# Patient Record
Sex: Male | Born: 1975 | Race: Black or African American | Hispanic: No | Smoking: Former smoker
Health system: Southern US, Community
[De-identification: ages and names within clinical notes are randomized; demographics above are authoritative.]

## PROBLEM LIST (undated history)

## (undated) DIAGNOSIS — I10 Essential (primary) hypertension: Secondary | ICD-10-CM

## (undated) HISTORY — PX: EYE SURGERY: SHX253

## (undated) HISTORY — PX: FACELIFT: SHX1566

---

## 2014-02-24 ENCOUNTER — Emergency Department (HOSPITAL_BASED_OUTPATIENT_CLINIC_OR_DEPARTMENT_OTHER): Payer: BC Managed Care – PPO

## 2014-02-24 ENCOUNTER — Emergency Department (HOSPITAL_BASED_OUTPATIENT_CLINIC_OR_DEPARTMENT_OTHER)
Admission: EM | Admit: 2014-02-24 | Discharge: 2014-02-24 | Disposition: A | Discharge status: Home / Self Care | Payer: BC Managed Care – PPO | Attending: Emergency Medicine | Admitting: Emergency Medicine

## 2014-02-24 ENCOUNTER — Encounter (HOSPITAL_BASED_OUTPATIENT_CLINIC_OR_DEPARTMENT_OTHER): Payer: Self-pay | Admitting: Emergency Medicine

## 2014-02-24 DIAGNOSIS — I10 Essential (primary) hypertension: Secondary | ICD-10-CM | POA: Insufficient documentation

## 2014-02-24 DIAGNOSIS — R000 Tachycardia, unspecified: Secondary | ICD-10-CM | POA: Insufficient documentation

## 2014-02-24 DIAGNOSIS — F17200 Nicotine dependence, unspecified, uncomplicated: Secondary | ICD-10-CM | POA: Insufficient documentation

## 2014-02-24 DIAGNOSIS — R209 Unspecified disturbances of skin sensation: Secondary | ICD-10-CM | POA: Insufficient documentation

## 2014-02-24 DIAGNOSIS — R079 Chest pain, unspecified: Secondary | ICD-10-CM | POA: Insufficient documentation

## 2014-02-24 DIAGNOSIS — R55 Syncope and collapse: Secondary | ICD-10-CM | POA: Insufficient documentation

## 2014-02-24 DIAGNOSIS — F410 Panic disorder [episodic paroxysmal anxiety] without agoraphobia: Secondary | ICD-10-CM | POA: Insufficient documentation

## 2014-02-24 DIAGNOSIS — I160 Hypertensive urgency: Secondary | ICD-10-CM

## 2014-02-24 HISTORY — DX: Essential (primary) hypertension: I10

## 2014-02-24 LAB — CBC
HEMATOCRIT: 44.8 % (ref 39.0–52.0)
Hemoglobin: 15.7 g/dL (ref 13.0–17.0)
MCH: 30.2 pg (ref 26.0–34.0)
MCHC: 35 g/dL (ref 30.0–36.0)
MCV: 86.2 fL (ref 78.0–100.0)
Platelets: 240 10*3/uL (ref 150–400)
RBC: 5.2 MIL/uL (ref 4.22–5.81)
RDW: 12.6 % (ref 11.5–15.5)
WBC: 8.5 10*3/uL (ref 4.0–10.5)

## 2014-02-24 LAB — PROTIME-INR
INR: 0.93 (ref 0.00–1.49)
PROTHROMBIN TIME: 12.3 s (ref 11.6–15.2)

## 2014-02-24 LAB — COMPREHENSIVE METABOLIC PANEL
ALT: 31 U/L (ref 0–53)
AST: 25 U/L (ref 0–37)
Albumin: 5.1 g/dL (ref 3.5–5.2)
Alkaline Phosphatase: 52 U/L (ref 39–117)
BILIRUBIN TOTAL: 0.5 mg/dL (ref 0.3–1.2)
BUN: 15 mg/dL (ref 6–23)
CO2: 26 meq/L (ref 19–32)
CREATININE: 1.2 mg/dL (ref 0.50–1.35)
Calcium: 10.2 mg/dL (ref 8.4–10.5)
Chloride: 100 mEq/L (ref 96–112)
GFR, EST AFRICAN AMERICAN: 87 mL/min — AB (ref >90)
GFR, EST NON AFRICAN AMERICAN: 75 mL/min — AB (ref >90)
Glucose, Bld: 100 mg/dL — ABNORMAL HIGH (ref 70–99)
Potassium: 3.8 mEq/L (ref 3.7–5.3)
Sodium: 143 mEq/L (ref 137–147)
Total Protein: 8.1 g/dL (ref 6.0–8.3)

## 2014-02-24 LAB — CK: Total CK: 182 U/L (ref 7–232)

## 2014-02-24 LAB — TROPONIN I: Troponin I: <0.3 ng/mL (ref <0.30)

## 2014-02-24 LAB — MAGNESIUM: MAGNESIUM: 2.3 mg/dL (ref 1.5–2.5)

## 2014-02-24 MED ORDER — LORAZEPAM 1 MG PO TABS: 1.0000 mg | ORAL_TABLET | Freq: Three times a day (TID) | ORAL | Status: DC | PRN

## 2014-02-24 MED ORDER — SODIUM CHLORIDE 0.9 % IV BOLUS (SEPSIS)
1000.0000 mL | Freq: Once | INTRAVENOUS | Status: AC
  Administered 2014-02-24: 1000 mL via INTRAVENOUS

## 2014-02-24 MED ORDER — HYDROCHLOROTHIAZIDE 25 MG PO TABS: 25.0000 mg | ORAL_TABLET | Freq: Every day | ORAL | Status: DC

## 2014-02-24 NOTE — ED Notes (Signed)
C/o waking up with feeling anxious and sob. States woke up in middle of night with sx also. State he went to the pool and was too anxious to get in. State his chest was hurting.

## 2014-02-24 NOTE — ED Notes (Signed)
1817 Pt is breathing easier with much reassurance from staff. Pt required one on one with EMT and RN  With trying to relax. Pt breathing normally at end of assessment.

## 2014-02-24 NOTE — ED Notes (Signed)
EDP aware of current vitals. NNO.

## 2014-02-24 NOTE — ED Notes (Addendum)
Pt able to move both hands and fingers and more calm and breathing normal ( resp. 18)

## 2014-02-24 NOTE — ED Notes (Signed)
Assumed care of patient from Hazel, RN.  

## 2014-02-24 NOTE — Discharge Instructions (Signed)
As discussed, your evaluation is largely reassuring.  However, with her persistent hypertension it is important that you followup with your primary care physician for further evaluation and management.  In addition, please take all prescribed medication as directed, and to do not hesitate to return here for any concerning changes in your condition.

## 2014-02-24 NOTE — ED Provider Notes (Signed)
CSN: [id number]     Arrival date & time 02/24/14  1803 History  This chart was scribed for Gerhard Munchobert Lockwood, MD by Phillis HaggisGabriella Gaje, ED Scribe. This patient was seen in room MH08/MH08 and patient care was started at 6:51 PM.     Chief Complaint  Patient presents with  . Panic Attack   The history is provided by the patient. No language interpreter was used.   HPI Comments: Melvin Rice is a 38 y.o. male with a history of HTN and panic attacks with associated chest pain who presents to the Emergency Department complaining of a panic attack onset one week ago. He states that he was having constant chest pain for a month, but it has been worsening over the past week. He said that he was on a business trip last week in [place] where he had LH, weakness. He states that earlier this morning he felt the dyspnea on his way to work so he pulled his car over. He says that he then developed tightness in his legs and numbness in his arms. Patient further reports that he drove himself home and returned to baseline after some rest. Earlier this afternoon, he was at the pool with his family earlier when started to feel more anxious and again developed chest tightness in the substernal area that radiated to his left chest. Patient also states that he has been having intermittent abdominal cramping and dark stools for the past week. He states that in 2003 he has had episodes like this, but it has not resurfaced until a month ago. He believes that these attacks are stress related. Patient reports loss of appetite, frequency, abdominal pain, chest tightness, numbness to bilateral LE and has lost 7 pounds. He states that he is now in a high pressure leadership role at [employer] where there are a lot of expectations at this job and it has been stressing him out. Patient has a history of hypertension, but he has not been taking any blood pressure medications. He states that he has been unable to get on medication due to his  schedule.  Patient reports that he sees Dr. Kevan [facility]Gates at [facility].    Past Medical History  Diagnosis Date  . Hypertension    No past surgical history on file. No family history on file. History  Substance Use Topics  . Smoking status: Current Every Day Smoker  . Smokeless tobacco: Not on file  . Alcohol Use: Yes     Comment: occ on weekends    Review of Systems  Constitutional:       Per HPI, otherwise negative  HENT:       Per HPI, otherwise negative  Respiratory:       Per HPI, otherwise negative  Cardiovascular:       Per HPI, otherwise negative  Gastrointestinal: Negative for vomiting.  Endocrine:       Negative aside from HPI  Genitourinary:       Neg aside from HPI   Musculoskeletal:       Per HPI, otherwise negative  Skin: Negative.   Neurological: Positive for syncope and numbness.  Psychiatric/Behavioral: The patient is nervous/anxious.      Allergies  Review of patient's allergies indicates no known allergies.  Home Medications   Prior to Admission medications   Not on File   BP 137/95  Pulse 104  Temp(Src) 98 F (36.7 C) (Oral)  Resp 22  Ht 5\' 10"  (1.778 m)  Wt 190  lb (86.183 kg)  BMI 27.26 kg/m2  SpO2 100% Physical Exam  Nursing note and vitals reviewed. Constitutional: He is oriented to person, place, and time. He appears well-developed. No distress.  HENT:  Head: Normocephalic and atraumatic.  Eyes: Conjunctivae and EOM are normal.  Cardiovascular: Normal rate, regular rhythm and normal heart sounds.   Pulmonary/Chest: Effort normal and breath sounds normal. No stridor. No respiratory distress. He has no wheezes. He has no rales.  Abdominal: He exhibits no distension.  Musculoskeletal: He exhibits no edema.  Neurological: He is alert and oriented to person, place, and time.  Negative SLR. Negative pronator drift.   Skin: Skin is warm and dry.  Psychiatric: He has a normal mood and affect.    ED Course  Procedures (including  critical care time)  COORDINATION OF CARE: 6:58 PM-Discussed treatment plan which includes EKG, CT and labs with pt at bedside and pt agreed to plan.    Labs Review Labs Reviewed  COMPREHENSIVE METABOLIC PANEL - Abnormal; Notable for the following:    Glucose, Bld 100 (*)    GFR calc non Af Amer 75 (*)    GFR calc Af Amer 87 (*)    All other components within normal limits  CBC  MAGNESIUM  PROTIME-INR  TROPONIN I  CK    Imaging Review Dg Chest 2 View  02/24/2014   CLINICAL DATA:  Mid it left-sided chest pain with numbness and weakness.  EXAM: CHEST  2 VIEW  COMPARISON:  None.  FINDINGS: The heart size and mediastinal contours are within normal limits. Both lungs are clear. The visualized skeletal structures are unremarkable.  IMPRESSION: No active cardiopulmonary disease.   Electronically Signed   By: Herbie BaltimoreWalt  Liebkemann M.D.   On: 02/24/2014 19:29     I reviewed the ECG from the clinic:  Stach, 124 - LVH abnormal   EKG Interpretation   Date/Time:  Tuesday February 24 2014 19:32:47 EDT Ventricular Rate:  86 PR Interval:  138 QRS Duration: 90 QT Interval:  384 QTC Calculation: 459 R Axis:   47 Text Interpretation:  Normal sinus rhythm Normal ECG Sinus rhythm Normal  ECG Confirmed by Gerhard MunchLOCKWOOD, ROBERT  MD ([id number]) on 02/24/2014 8:11:51 PM     8:40 PM Are exam the patient is calm.  Heart rate is now less than 80.  Blood pressure remains elevated.  I had a lengthy conversation with him in multiple family members about all results, empiric initiation of antibiotics medication as well as anxiolytics. Patient will follow up with primary care within one week.  MDM   I personally performed the services described in this documentation, which was scribed in my presence. The recorded information has been reviewed and is accurate.  Patient presents with episodic chest pain, dyspnea, anxiety. Patient's episode going back years, but more frequently over the past month. On exam patient is  awake, alert, afebrile. Patient is tachycardic, hypertensive. Patient's evaluation does not demonstrate end organ effects of hypertension, and following fluids, meds, patient's symptoms resolved.  One half patient was mildly hypotensive. The patient's description of prior episodes of hypertension, with no ongoing medication use, he will be started on a course of antihypertensives. Patient has a primary care physician with whom he will followup to ensure appropriate medication use. Absence of distress, neurologic deficits, there is low suspicion for CNS etiology. With no ongoing dyspnea, tachypnea, tachycardia, PE does not take this process either. Patient had mild tachycardia, but no ischemic changes, and with a negative troponin,  in spite of significant ongoing episodes of chest pain, there is likely no ongoing coronary ischemia. Given the patient's resolution of symptoms, the absence of distress on repeat exam, he was appropriate for further evaluation and management as an outpatient.     Gerhard Munch, MD 02/24/14 2044

## 2018-09-03 ENCOUNTER — Other Ambulatory Visit: Payer: Self-pay

## 2018-09-03 ENCOUNTER — Encounter (HOSPITAL_BASED_OUTPATIENT_CLINIC_OR_DEPARTMENT_OTHER): Payer: Self-pay | Admitting: *Deleted

## 2018-09-03 ENCOUNTER — Emergency Department (HOSPITAL_BASED_OUTPATIENT_CLINIC_OR_DEPARTMENT_OTHER): Payer: BLUE CROSS/BLUE SHIELD

## 2018-09-03 ENCOUNTER — Emergency Department (HOSPITAL_BASED_OUTPATIENT_CLINIC_OR_DEPARTMENT_OTHER)
Admission: EM | Admit: 2018-09-03 | Discharge: 2018-09-03 | Disposition: A | Discharge status: Home / Self Care | Payer: BLUE CROSS/BLUE SHIELD | Attending: Emergency Medicine | Admitting: Emergency Medicine

## 2018-09-03 DIAGNOSIS — I10 Essential (primary) hypertension: Secondary | ICD-10-CM | POA: Insufficient documentation

## 2018-09-03 DIAGNOSIS — F17290 Nicotine dependence, other tobacco product, uncomplicated: Secondary | ICD-10-CM | POA: Diagnosis not present

## 2018-09-03 LAB — TROPONIN I: Troponin I: <0.03 ng/mL (ref <0.03)

## 2018-09-03 LAB — CBC WITH DIFFERENTIAL/PLATELET
Abs Immature Granulocytes: 0.02 10*3/uL (ref 0.00–0.07)
BASOS PCT: 0 %
Basophils Absolute: 0 10*3/uL (ref 0.0–0.1)
EOS ABS: 0 10*3/uL (ref 0.0–0.5)
Eosinophils Relative: 1 %
HEMATOCRIT: 45 % (ref 39.0–52.0)
Hemoglobin: 15.3 g/dL (ref 13.0–17.0)
Immature Granulocytes: 0 %
Lymphocytes Relative: 23 %
Lymphs Abs: 1.4 10*3/uL (ref 0.7–4.0)
MCH: 32.2 pg (ref 26.0–34.0)
MCHC: 34 g/dL (ref 30.0–36.0)
MCV: 94.7 fL (ref 80.0–100.0)
MONOS PCT: 10 %
Monocytes Absolute: 0.6 10*3/uL (ref 0.1–1.0)
NRBC: 0 % (ref 0.0–0.2)
Neutro Abs: 4.1 10*3/uL (ref 1.7–7.7)
Neutrophils Relative %: 66 %
PLATELETS: 204 10*3/uL (ref 150–400)
RBC: 4.75 MIL/uL (ref 4.22–5.81)
RDW: 12 % (ref 11.5–15.5)
WBC: 6.2 10*3/uL (ref 4.0–10.5)

## 2018-09-03 LAB — COMPREHENSIVE METABOLIC PANEL
ALK PHOS: 44 U/L (ref 38–126)
ALT: 29 U/L (ref 0–44)
AST: 28 U/L (ref 15–41)
Albumin: 4.7 g/dL (ref 3.5–5.0)
Anion gap: 10 (ref 5–15)
BUN: 17 mg/dL (ref 6–20)
CALCIUM: 9.2 mg/dL (ref 8.9–10.3)
CO2: 26 mmol/L (ref 22–32)
Chloride: 103 mmol/L (ref 98–111)
Creatinine, Ser: 0.98 mg/dL (ref 0.61–1.24)
GFR calc non Af Amer: >60 mL/min (ref >60)
GLUCOSE: 81 mg/dL (ref 70–99)
Potassium: 3.3 mmol/L — ABNORMAL LOW (ref 3.5–5.1)
Sodium: 139 mmol/L (ref 135–145)
TOTAL PROTEIN: 7.4 g/dL (ref 6.5–8.1)
Total Bilirubin: 0.8 mg/dL (ref 0.3–1.2)

## 2018-09-03 MED ORDER — LISINOPRIL-HYDROCHLOROTHIAZIDE 20-12.5 MG PO TABS: 1.0000 | ORAL_TABLET | Freq: Every day | ORAL | 0 refills | Status: AC

## 2018-09-03 NOTE — ED Triage Notes (Signed)
He stopped taking his Lisinopril in March. Here today with c.o chest tightness and palpitation.

## 2018-09-03 NOTE — Discharge Instructions (Signed)
Please begin retaking your hypertension medication daily. Follow-up with your primary care provider as soon as possible on this matter. Your potassium was mildly lower than normal today.  This value should be retested within a week or two. Return to the ED for chest pain, shortness of breath, passing out, weakness, numbness, or any other major concerns.

## 2018-09-03 NOTE — ED Provider Notes (Signed)
[facility] EMERGENCY DEPARTMENT Provider Note   CSN: [id number] Arrival date & time: 09/03/18  1912     History   Chief Complaint Chief Complaint  Patient presents with  . Hypertension    HPI Melvin Rice is a 42 y.o. male.  HPI  Melvin Rice is a 42 y.o. male, with a history of HTN, presenting to the ED with hypertension.  Patient became concerned because he had an episode of lightheadedness while playing golf today.  On the way to the ED he had a fleeting moment of chest pain.  This resolved and has not recurred. He states he was previously on combination lisinopril-HCTZ 20-12.5, but ran out of this medication in March and has not renewed it because he does not like going to the doctor.  Denies dizziness, vision changes, headache, epistaxis, neurologic dysfunction, shortness of breath, changes in urination, or any other complaints.  Past Medical History:  Diagnosis Date  . Hypertension     There are no active problems to display for this patient.   Past Surgical History:  Procedure Laterality Date  . EYE SURGERY    . FACELIFT          Home Medications    Prior to Admission medications   Medication Sig Start Date End Date Taking? Authorizing Provider  lisinopril-hydrochlorothiazide (PRINZIDE,ZESTORETIC) 20-12.5 MG tablet Take 1 tablet by mouth daily. 09/03/18   Anselm PancoastJoy, Rexford Prevo C, PA-C    Family History No family history on file.  Social History Social History   Tobacco Use  . Smoking status: Former Games developermoker  . Smokeless tobacco: Current User    Types: Snuff  Substance Use Topics  . Alcohol use: Yes    Comment: occ on weekends  . Drug use: Never     Allergies   Patient has no known allergies.   Review of Systems Review of Systems  Constitutional: Negative for chills and fever.  HENT: Negative for nosebleeds.   Eyes: Negative for visual disturbance.  Respiratory: Negative for shortness of breath.   Cardiovascular: Positive for  chest pain (momentary).  Gastrointestinal: Negative for abdominal pain, nausea and vomiting.  Genitourinary: Negative for difficulty urinating.  Musculoskeletal: Negative for neck pain and neck stiffness.  Neurological: Negative for dizziness, syncope, weakness, light-headedness, numbness and headaches.  Psychiatric/Behavioral: Negative for confusion.  All other systems reviewed and are negative.    Physical Exam Updated Vital Signs BP (!) 196/124 (BP Location: Right Arm)   Pulse 82   Temp 98.2 F (36.8 C)   Resp 16   Ht 5' 9.5" (1.765 m)   Wt 79.8 kg   SpO2 99%   BMI 25.62 kg/m   Physical Exam Vitals signs and nursing note reviewed.  Constitutional:      General: He is not in acute distress.    Appearance: He is well-developed. He is not diaphoretic.  HENT:     Head: Normocephalic and atraumatic.  Eyes:     Conjunctiva/sclera: Conjunctivae normal.  Neck:     Musculoskeletal: Neck supple.  Cardiovascular:     Rate and Rhythm: Normal rate and regular rhythm.     Heart sounds: Normal heart sounds.  Pulmonary:     Effort: Pulmonary effort is normal. No respiratory distress.     Breath sounds: Normal breath sounds.  Abdominal:     Palpations: Abdomen is soft.     Tenderness: There is no abdominal tenderness. There is no guarding.  Lymphadenopathy:     Cervical: No cervical adenopathy.  Skin:    General: Skin is warm and dry.  Neurological:     Mental Status: He is alert.     Comments: Sensation grossly intact to light touch in the extremities. Strength 5/5 in all extremities. No gait disturbance. Coordination intact. Cranial nerves III-XII grossly intact. No facial droop.   Psychiatric:        Behavior: Behavior normal.      ED Treatments / Results  Labs (all labs ordered are listed, but only abnormal results are displayed) Labs Reviewed  COMPREHENSIVE METABOLIC PANEL - Abnormal; Notable for the following components:      Result Value   Potassium 3.3 (*)     All other components within normal limits  CBC WITH DIFFERENTIAL/PLATELET  TROPONIN I    EKG EKG Interpretation  Date/Time:  Tuesday September 03 2018 19:31:18 EST Ventricular Rate:  88 PR Interval:  130 QRS Duration: 84 QT Interval:  344 QTC Calculation: 416 R Axis:   63 Text Interpretation:  Normal sinus rhythm Normal ECG No significant change since last tracing Confirmed by Gwyneth SproutPlunkett, Whitney ([id number]) on 09/03/2018 8:12:31 PM   Radiology Dg Chest 2 View  Result Date: 09/03/2018 CLINICAL DATA:  Acute onset of left upper chest pain and palpitations. EXAM: CHEST - 2 VIEW COMPARISON:  Chest radiograph performed 02/24/2014 FINDINGS: The lungs are well-aerated and clear. There is no evidence of focal opacification, pleural effusion or pneumothorax. The heart is normal in size; the mediastinal contour is within normal limits. No acute osseous abnormalities are seen. IMPRESSION: No acute cardiopulmonary process seen. Electronically Signed   By: Roanna RaiderJeffery  Chang M.D.   On: 09/03/2018 22:12    Procedures Procedures (including critical care time)  Medications Ordered in ED Medications - No data to display   Initial Impression / Assessment and Plan / ED Course  I have reviewed the triage vital signs and the nursing notes.  Pertinent labs & imaging results that were available during my care of the patient were reviewed by me and considered in my medical decision making (see chart for details).     Patient presents with hypertension.  No focal neuro deficits.  Troponin negative.  No evidence of renal dysfunction.  No cardiomegaly.  Doubt hypertensive emergency.  I wrote patient a small prescription for his previously prescribed antihypertensive.  Patient was strongly encouraged to follow-up with his PCP.  He vowed to do so.  Return precautions discussed.  Patient voices understanding of these instructions, accepts the plan, and is comfortable with discharge.  Final Clinical Impressions(s) /  ED Diagnoses   Final diagnoses:  Hypertension, unspecified type    ED Discharge Orders         Ordered    lisinopril-hydrochlorothiazide (PRINZIDE,ZESTORETIC) 20-12.5 MG tablet  Daily     09/03/18 2259           Anselm PancoastJoy, Kasen Sako C, PA-C 09/04/18 0011    Gwyneth SproutPlunkett, Whitney, MD 09/05/18 2200

## 2019-11-15 ENCOUNTER — Ambulatory Visit: Payer: BLUE CROSS/BLUE SHIELD

## 2019-11-24 ENCOUNTER — Ambulatory Visit: Payer: Self-pay | Attending: Internal Medicine

## 2019-11-24 DIAGNOSIS — Z23 Encounter for immunization: Secondary | ICD-10-CM

## 2019-11-24 NOTE — Progress Notes (Signed)
   Covid-19 Vaccination Clinic  Name:  Chasyn Cinque    MRN: [id number] DOB: May 17, 1976  11/24/2019  Mr. Shrout was observed post Covid-19 immunization for 15 minutes without incident. He was provided with Vaccine Information Sheet and instruction to access the V-Safe system.   Mr. Lange was instructed to call 911 with any severe reactions post vaccine: Marland Kitchen Difficulty breathing  . Swelling of face and throat  . A fast heartbeat  . A bad rash all over body  . Dizziness and weakness   Immunizations Administered    Name Date Dose VIS Date Route   Pfizer COVID-19 Vaccine 11/24/2019 11:40 AM 0.3 mL 08/15/2019 Intramuscular   Manufacturer: [employer], [employer]   Lot: [id number]   NDC: [id number]

## 2019-12-17 ENCOUNTER — Ambulatory Visit: Payer: Self-pay | Attending: Internal Medicine

## 2019-12-17 DIAGNOSIS — Z23 Encounter for immunization: Secondary | ICD-10-CM

## 2019-12-17 NOTE — Progress Notes (Signed)
   Covid-19 Vaccination Clinic  Name:  Blanchard Willhite    MRN: [id number] DOB: 1976/01/12  12/17/2019  Mr. Bohorquez was observed post Covid-19 immunization for 15 minutes without incident. He was provided with Vaccine Information Sheet and instruction to access the V-Safe system.   Mr. Kohles was instructed to call 911 with any severe reactions post vaccine: Marland Kitchen Difficulty breathing  . Swelling of face and throat  . A fast heartbeat  . A bad rash all over body  . Dizziness and weakness   Immunizations Administered    Name Date Dose VIS Date Route   Pfizer COVID-19 Vaccine 12/17/2019 11:50 AM 0.3 mL 08/15/2019 Intramuscular   Manufacturer: [employer], [employer]   Lot: [id number]   NDC: [address]-9

## 2020-01-23 IMAGING — DX DG CHEST 2V
2 series · 2 of 2 positions shown · non-contrast
Comparison: Chest radiograph performed 02/24/2014

CLINICAL DATA: Acute onset of left upper chest pain and
palpitations.

EXAM:
CHEST - 2 VIEW

[chest pa]
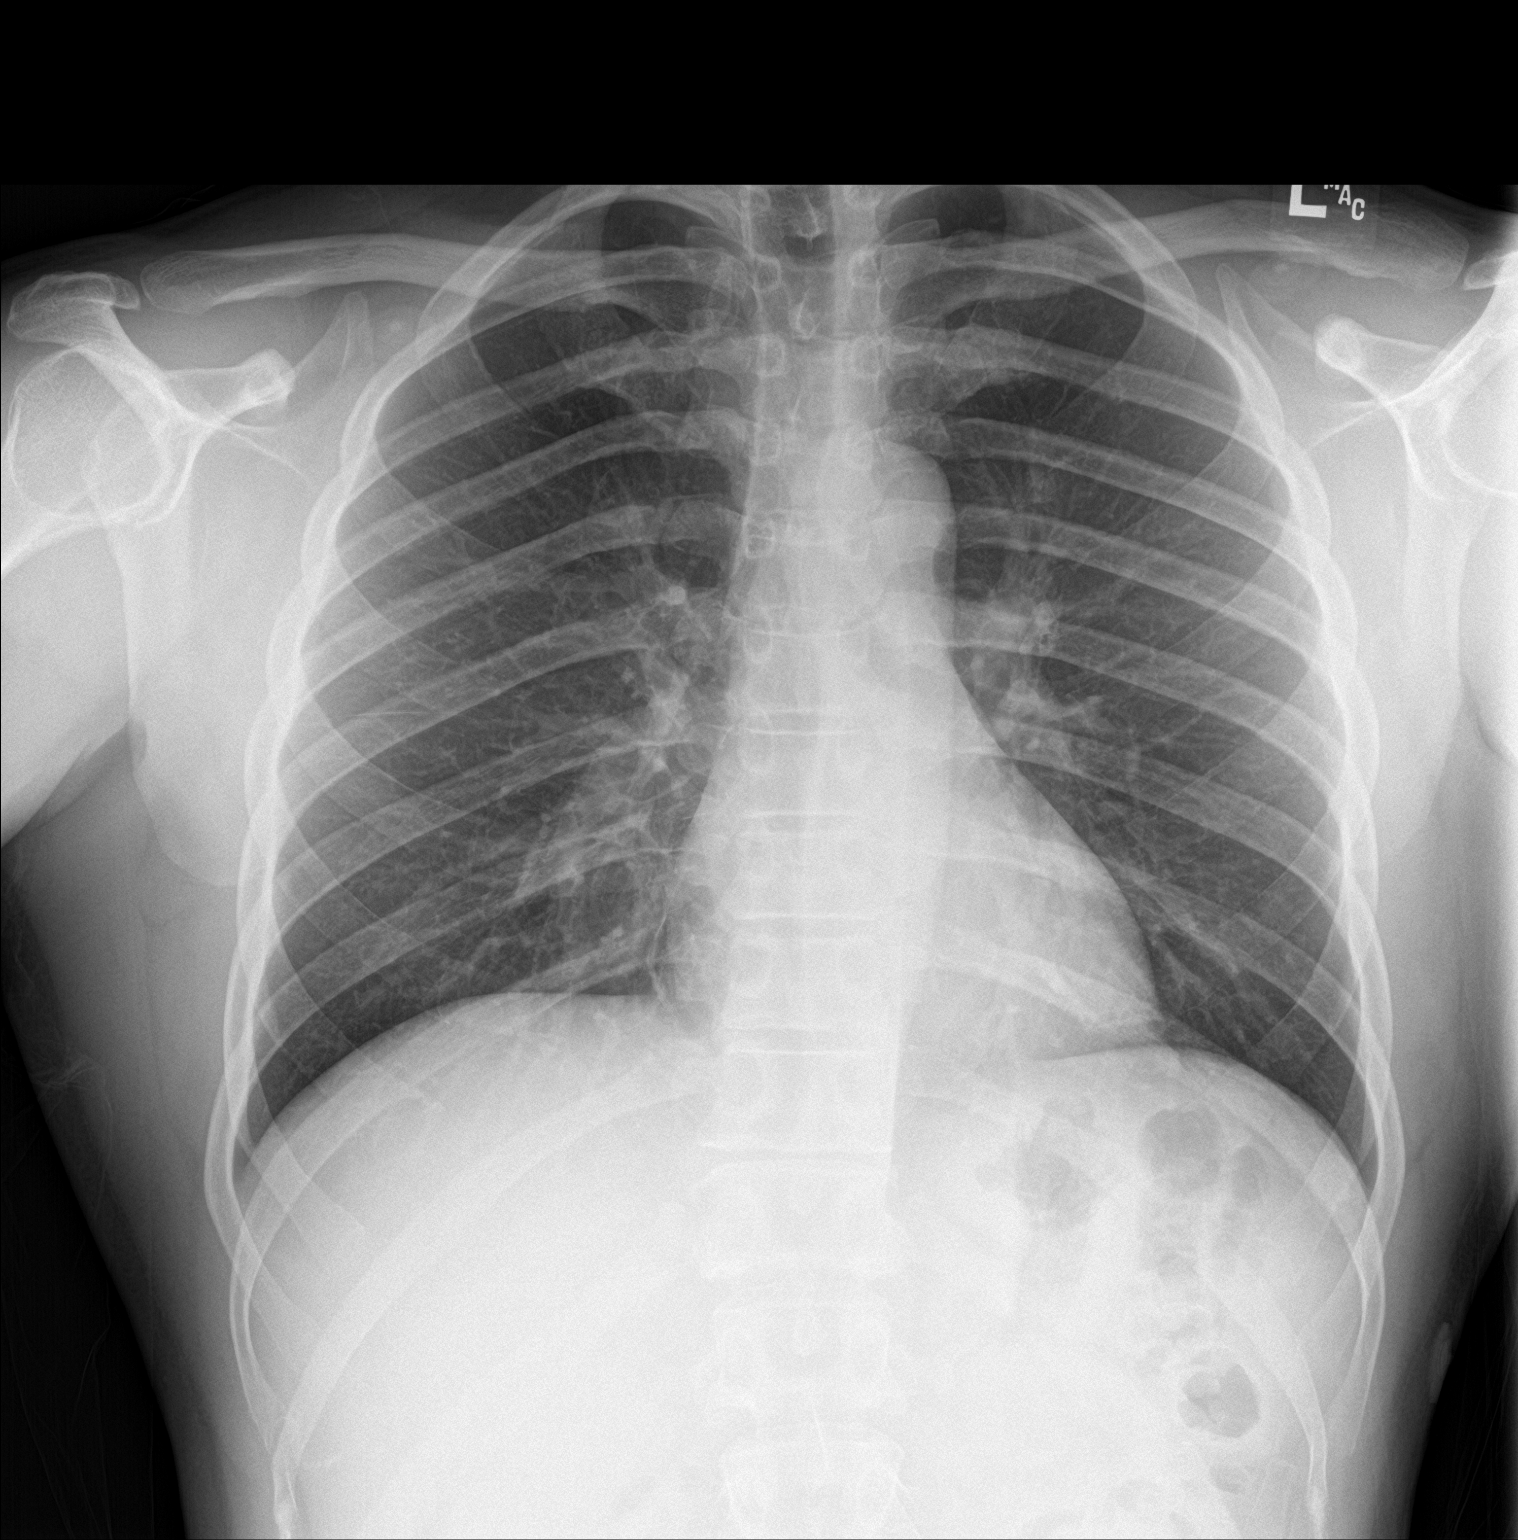

[chest lat]
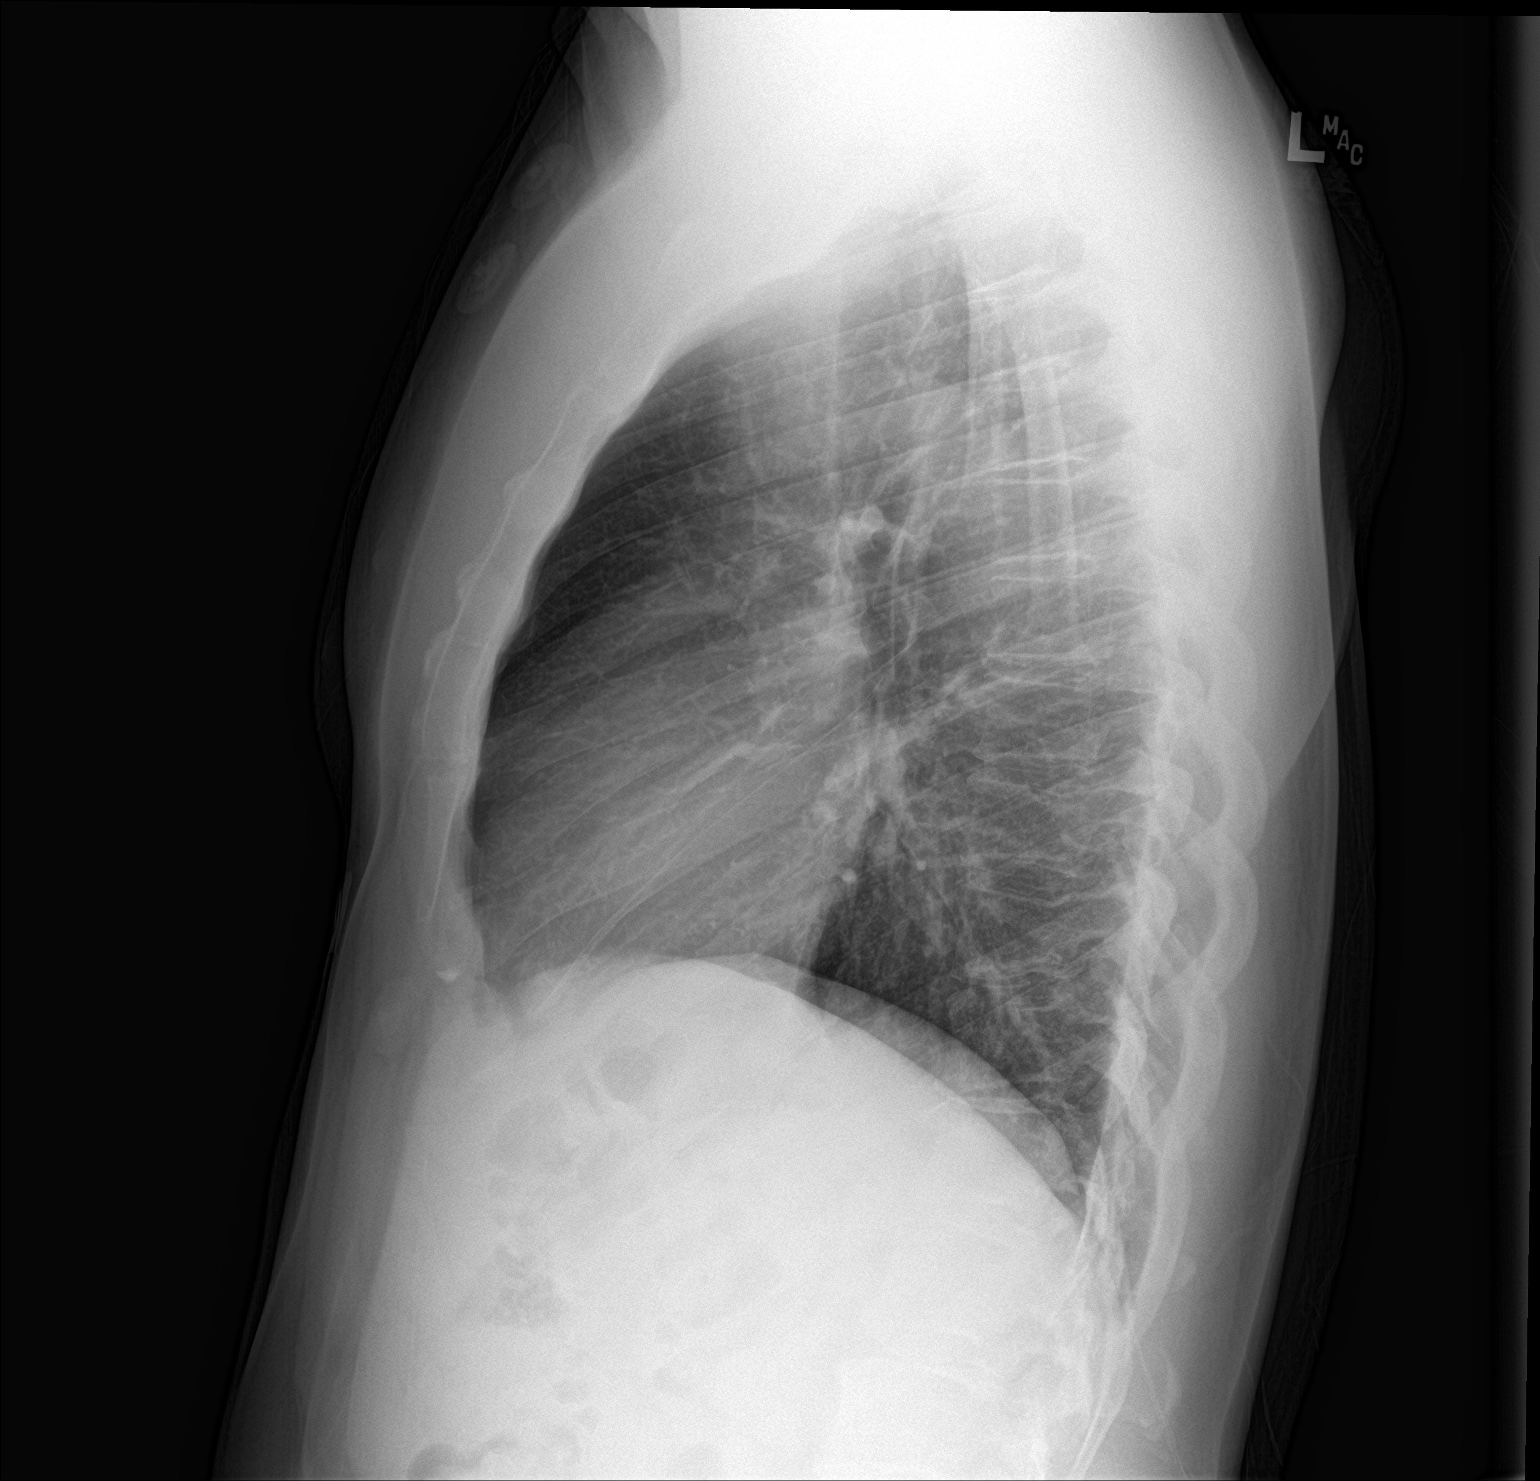

[2 of 2 positions shown; findings below may reference images not displayed]

FINDINGS: The lungs are well-aerated and clear. There is no evidence of focal
opacification, pleural effusion or pneumothorax.

The heart is normal in size; the mediastinal contour is within
normal limits. No acute osseous abnormalities are seen.
IMPRESSION: No acute cardiopulmonary process seen.
